# Patient Record
Sex: Male | Born: 1990 | Race: White | Hispanic: No | Marital: Single | Smoking: Never smoker
Health system: Southern US, Community
[De-identification: ages and names within clinical notes are randomized; demographics above are authoritative.]

## PROBLEM LIST (undated history)

## (undated) DIAGNOSIS — F988 Other specified behavioral and emotional disorders with onset usually occurring in childhood and adolescence: Secondary | ICD-10-CM

## (undated) HISTORY — PX: WISDOM TOOTH EXTRACTION: SHX21

---

## 2014-02-23 ENCOUNTER — Emergency Department (HOSPITAL_COMMUNITY)
Admission: EM | Admit: 2014-02-23 | Discharge: 2014-02-24 | Disposition: A | Discharge status: Home / Self Care | Payer: BC Managed Care – PPO | Attending: Emergency Medicine | Admitting: Emergency Medicine

## 2014-02-23 ENCOUNTER — Encounter (HOSPITAL_COMMUNITY): Payer: Self-pay | Admitting: Emergency Medicine

## 2014-02-23 DIAGNOSIS — N509 Disorder of male genital organs, unspecified: Secondary | ICD-10-CM | POA: Insufficient documentation

## 2014-02-23 DIAGNOSIS — Z8659 Personal history of other mental and behavioral disorders: Secondary | ICD-10-CM | POA: Insufficient documentation

## 2014-02-23 DIAGNOSIS — I861 Scrotal varices: Secondary | ICD-10-CM | POA: Diagnosis not present

## 2014-02-23 DIAGNOSIS — N50812 Left testicular pain: Secondary | ICD-10-CM

## 2014-02-23 HISTORY — DX: Other specified behavioral and emotional disorders with onset usually occurring in childhood and adolescence: F98.8

## 2014-02-23 NOTE — ED Provider Notes (Signed)
CSN: [id number]     Arrival date & time 02/23/14  2307 History   First MD Initiated Contact with Patient 02/23/14 2328     Chief Complaint  Patient presents with  . Testicle Pain  . Urinary Retention     (Consider location/radiation/quality/duration/timing/severity/associated sxs/prior Treatment) HPI  Sean Taylor is a 23 y.o. male who is here for intermittent left scrotal pain which started after sexual intercourse 2.5 hours ago. The pain comes in flashes of sharpness, 2- 20 times per minute, sporadically. Currently, pain is gone. He also feels that he cannot void. No specific trauma. No prior similar problem. No other known modifying factors.  Past Medical History  Diagnosis Date  . ADD (attention deficit disorder)    Past Surgical History  Procedure Laterality Date  . Wisdom tooth extraction     History reviewed. No pertinent family history. History  Substance Use Topics  . Smoking status: Never Smoker   . Smokeless tobacco: Not on file  . Alcohol Use: Yes    Review of Systems  All other systems reviewed and are negative.     Allergies  Review of patient's allergies indicates no known allergies.  Home Medications   Prior to Admission medications   Medication Sig Start Date End Date Taking? Authorizing Provider  traMADol (ULTRAM) 50 MG tablet Take 1 tablet (50 mg total) by mouth every 6 (six) hours as needed for moderate pain or severe pain. 02/24/14   Olivia Mackie, MD   BP 111/67  Pulse 61  Temp(Src) 99.3 F (37.4 C) (Oral)  Resp 18  Ht  (1.854 m)  Wt 195 lb (88.451 kg)  BMI 25.73 kg/m2  SpO2 97% Physical Exam  Nursing note and vitals reviewed. Constitutional: He is oriented to person, place, and time. He appears well-developed and well-nourished.  HENT:  Head: Normocephalic and atraumatic.  Right Ear: External ear normal.  Left Ear: External ear normal.  Eyes: Conjunctivae and EOM are normal. Pupils are equal, round, and reactive to light.  Neck:  Normal range of motion and phonation normal. Neck supple.  Cardiovascular: Normal rate, regular rhythm and normal heart sounds.   Pulmonary/Chest: Effort normal and breath sounds normal. He exhibits no bony tenderness.  Abdominal: Soft. There is no tenderness.  Genitourinary:  Circumsized, no urethral discharge. Testicles with normal lie. Scrotum with normal appearance. Testicles are not tender. Mildly tender left, epididymitis area. No groin swelling, mass/nodes, or tenderness.  Musculoskeletal: Normal range of motion.  Neurological: He is alert and oriented to person, place, and time. No cranial nerve deficit or sensory deficit. He exhibits normal muscle tone. Coordination normal.  Skin: Skin is warm, dry and intact.  Psychiatric: He has a normal mood and affect. His behavior is normal. Judgment and thought content normal.    ED Course  Procedures (including critical care time)  2338- [place] ordered to evaluate scrotal contents. Pt declined analgesia.  Medications - No data to display  No data found.     Labs Review Labs Reviewed  URINALYSIS, ROUTINE W REFLEX MICROSCOPIC    Imaging Review No results found.   EKG Interpretation None      MDM   Final diagnoses:  Testicular pain, left  Varicocele    Nursing Notes Reviewed/ Care Coordinated Applicable Imaging Reviewed Interpretation of Laboratory Data incorporated into ED treatment  Disposition per Dr. Norlene Campbell after Testicular U/S    Flint Melter, MD 02/26/14 [phone]

## 2014-02-23 NOTE — ED Notes (Signed)
Pt presents with Left testicular pain and urinary retention starting "a few hours ago." Pt reports increase pain when he goes from a lying to a standing position. Pt denies penile discharge or bleeding

## 2014-02-24 ENCOUNTER — Emergency Department (HOSPITAL_COMMUNITY): Payer: BC Managed Care – PPO

## 2014-02-24 LAB — URINALYSIS, ROUTINE W REFLEX MICROSCOPIC
Bilirubin Urine: NEGATIVE
Glucose, UA: NEGATIVE mg/dL
Hgb urine dipstick: NEGATIVE
Ketones, ur: NEGATIVE mg/dL
Leukocytes, UA: NEGATIVE
NITRITE: NEGATIVE
PROTEIN: NEGATIVE mg/dL
SPECIFIC GRAVITY, URINE: 1.02 (ref 1.005–1.030)
Urobilinogen, UA: 0.2 mg/dL (ref 0.0–1.0)
pH: 7.5 (ref 5.0–8.0)

## 2014-02-24 MED ORDER — TRAMADOL HCL 50 MG PO TABS: 50.0000 mg | ORAL_TABLET | Freq: Four times a day (QID) | ORAL | Status: AC | PRN

## 2014-02-24 NOTE — Discharge Instructions (Signed)
Varicocele A varicocele is a swelling of veins in the scrotum (the bag of skin that contains the testicles). It is most common in young men. It occurs most often on the left side. Small or painless varicoceles do not need treatment. Most often, this is not a serious problem, but further tests may be needed to confirm the diagnosis. Surgery may be needed if complications of varicoceles arise. Rarely, varicoceles can reoccur after surgery. CAUSES  The swelling is due to blood backing up in the vein that leads from the testicle back to the body. Blood backs up because the valves inside the vein are not working properly. Veins normally return blood to the heart. Valves in veins are supposed to be one-way valves. They should not allow blood to flow backwards. If the valves do not work well, blood can pool in a vein and make it swell. The same thing happens with varicose veins in the leg. SYMPTOMS  A varicocele most often causes no symptoms. When they occur, symptoms include:   Swelling on one side of the scrotum.  Swelling that is more obvious when standing up.  A lumpy feeling in the scrotum.  Heaviness on one side of the scrotum.  Dull ache in the scrotum, especially after exercise or prolonged standing or sitting.  Slower growth or reduced size of the testicle on the side of the varicocele (in young males).  Problems with fertility can arise if the testicle does not grow normally. DIAGNOSIS  Varicocele is usually diagnosed by a physical exam. Sometimes ultrasonography is done. TREATMENT  Usually, varicoceles need no treatment. They are often routinely monitored on exam by your caregiver to ensure they do not slow the growth of the testicle on that side. Treatment may be needed if:  The varicocele is large.  There is a lot of pain.  The varicocele causes a decrease in the size of the testicle in a growing adolescent.  The other testicle is absent or not normal.  Varicoceles are found on  both sides of the scrotum.  There is pain when exercising.  There are fertility problems. There are two types of treatment:  Surgery. The surgeon ties off the swollen veins. Surgery may be done with an incision in the skin or through a laparoscope. The surgery is usually done in an outpatient setting. Outpatient means there is no overnight stay in a hospital.  Embolization. A small tube is placed in a vein and guided into the swollen veins. X-rays are used to guide the small tube. Tiny metal coils or other blocking items are put through the tube. This blocks swollen veins and the flow of blood. This is usually done in an outpatient setting without the use of general anesthesia. HOME CARE INSTRUCTIONS  To decrease discomfort:  Wear supportive underwear.  Use an athletic supporter for sports.  Only take over-the-counter or prescription medicines for pain or discomfort as directed by your caregiver. SEEK MEDICAL CARE IF:   Pain is increasing.  Swelling does not decrease when lying down.  Testicle is smaller.  The testicle becomes enlarged, swollen, red, or painful. Document Released: 08/22/2000 Document Revised: 08/08/2011 Document Reviewed: 08/26/2009 Exit[employer] Patient Information 2015 [employer]. This information is not intended to replace advice given to you by your health care provider. Make sure you discuss any questions you have with your health care provider.  

## 2014-02-24 NOTE — ED Notes (Signed)
Patient transported to Ultrasound 

## 2014-02-24 NOTE — ED Provider Notes (Signed)
Care assumed from Dr Effie Shy awaiting ua and ultrasound due to left testicular pain.  Clinical exam not c/w torsion.   Results for orders placed during the hospital encounter of 02/23/14  URINALYSIS, ROUTINE W REFLEX MICROSCOPIC      Result Value Ref Range   Color, Urine YELLOW  YELLOW   APPearance CLEAR  CLEAR   Specific Gravity, Urine 1.020  1.005 - 1.030   pH 7.5  5.0 - 8.0   Glucose, UA NEGATIVE  NEGATIVE mg/dL   Hgb urine dipstick NEGATIVE  NEGATIVE   Bilirubin Urine NEGATIVE  NEGATIVE   Ketones, ur NEGATIVE  NEGATIVE mg/dL   Protein, ur NEGATIVE  NEGATIVE mg/dL   Urobilinogen, UA 0.2  0.0 - 1.0 mg/dL   Nitrite NEGATIVE  NEGATIVE   Leukocytes, UA NEGATIVE  NEGATIVE   US Scrotum  02/24/2014   CLINICAL DATA:  Left scrotal pain  EXAM: SCROTAL ULTRASOUND  DOPPLER ULTRASOUND OF THE TESTICLES  TECHNIQUE: Complete ultrasound examination of the testicles, epididymis, and other scrotal structures was performed. Color and spectral Doppler ultrasound were also utilized to evaluate blood flow to the testicles.  COMPARISON:  None.  FINDINGS: Right testicle  Measurements: 5.3 x 2.6 x 3.3 cm. No mass or microlithiasis visualized.  Left testicle  Measurements: 4.8 x 2.7 x 3.5 cm. No mass or microlithiasis visualized.  Right epididymis:  Normal in size and appearance.  Left epididymis:  Normal in size and appearance.  Hydrocele:  Physiologic fluid bilaterally, left greater than right.  Varicocele:  Small left varicocele.  Pulsed Doppler interrogation of both testes demonstrates low resistance arterial and venous waveforms bilaterally.  IMPRESSION: Small left varicocele.  Otherwise negative scrotal ultrasound.  No evidence of testicular torsion.   Electronically Signed   By: Charline Bills M.D.   On: 02/24/2014 01:40   [place] Art/ven Flow Abd Pelv Doppler  02/24/2014   CLINICAL DATA:  Left scrotal pain  EXAM: SCROTAL ULTRASOUND  DOPPLER ULTRASOUND OF THE TESTICLES  TECHNIQUE: Complete ultrasound examination  of the testicles, epididymis, and other scrotal structures was performed. Color and spectral Doppler ultrasound were also utilized to evaluate blood flow to the testicles.  COMPARISON:  None.  FINDINGS: Right testicle  Measurements: 5.3 x 2.6 x 3.3 cm. No mass or microlithiasis visualized.  Left testicle  Measurements: 4.8 x 2.7 x 3.5 cm. No mass or microlithiasis visualized.  Right epididymis:  Normal in size and appearance.  Left epididymis:  Normal in size and appearance.  Hydrocele:  Physiologic fluid bilaterally, left greater than right.  Varicocele:  Small left varicocele.  Pulsed Doppler interrogation of both testes demonstrates low resistance arterial and venous waveforms bilaterally.  IMPRESSION: Small left varicocele.  Otherwise negative scrotal ultrasound.  No evidence of testicular torsion.   Electronically Signed   By: Charline Bills M.D.   On: 02/24/2014 01:40    Will d/c home to f/u with urology, pain medication.  Olivia Mackie, MD 02/24/14 [phone]

## 2015-03-04 IMAGING — US US ART/VEN ABD/PELV/SCROTUM DOPPLER LTD
1 series · 14 of 25 positions shown · non-contrast
Comparison: None.

CLINICAL DATA: Left scrotal pain

EXAM:
SCROTAL ULTRASOUND
DOPPLER ULTRASOUND OF THE TESTICLES
TECHNIQUE: Complete ultrasound examination of the testicles, epididymis, and
other scrotal structures was performed. Color and spectral Doppler
ultrasound were also utilized to evaluate blood flow to the
testicles.

[Series 1: us art/ven abd/pelv/scrotum doppler ltd · 0.07mm/px · 14 of 49 slices shown]
[im 1/49]
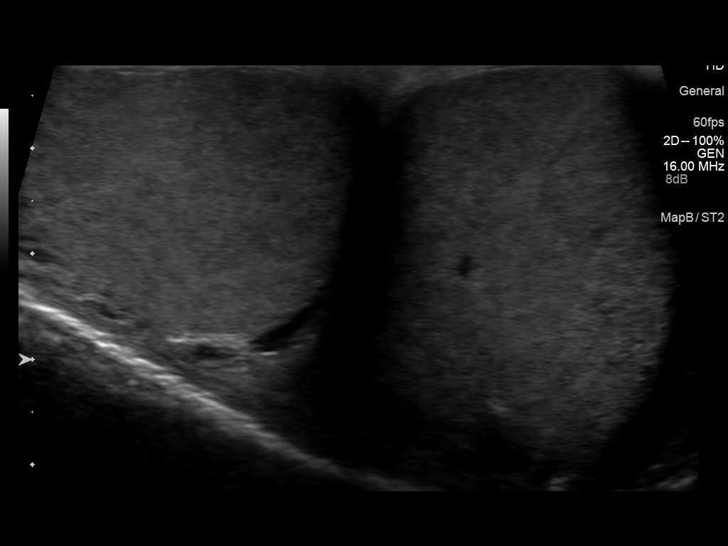
[im 5/49]
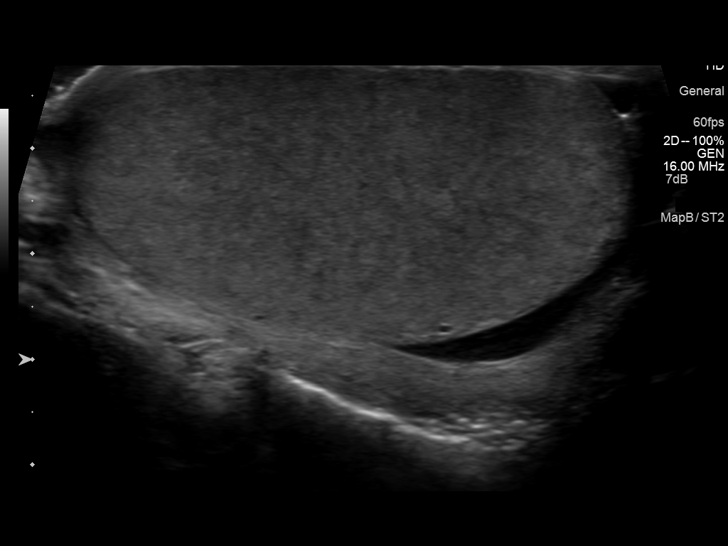
[im 9/49]
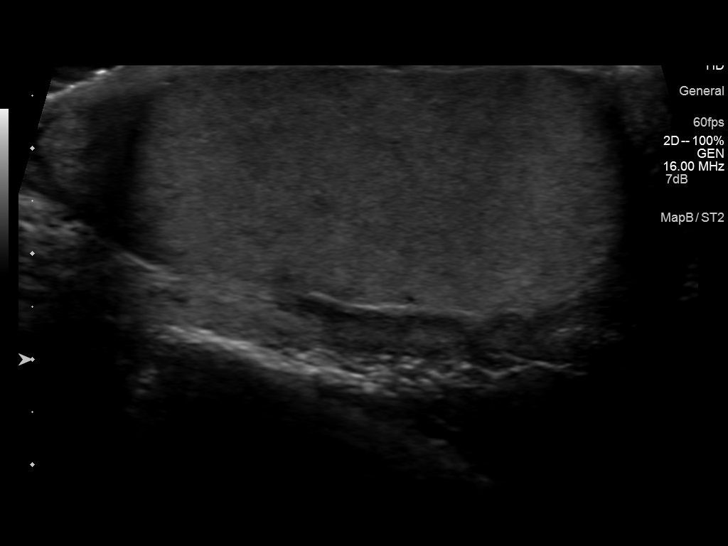
[im 13/49]
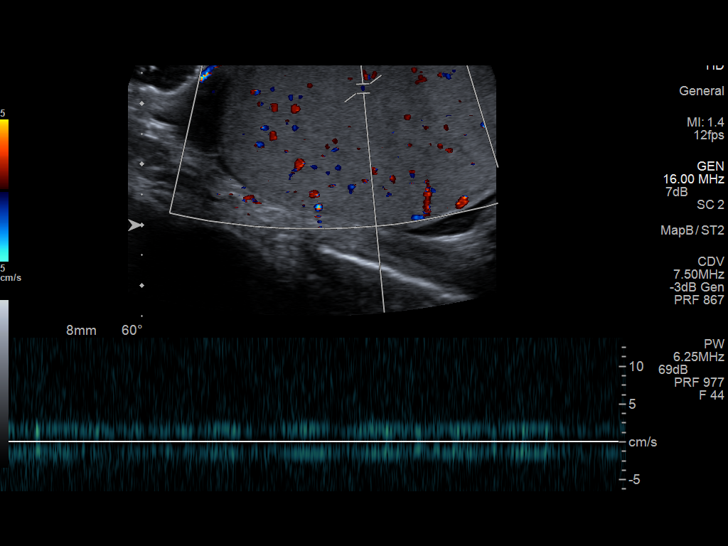
[im 17/49]
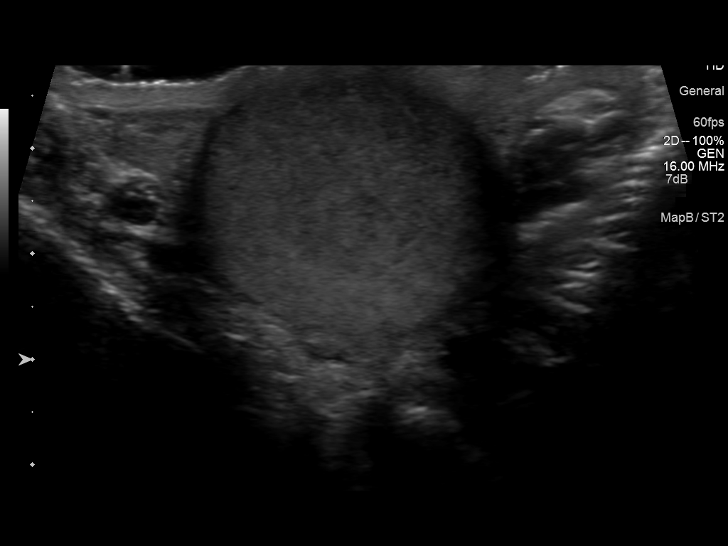
[im 19/49]
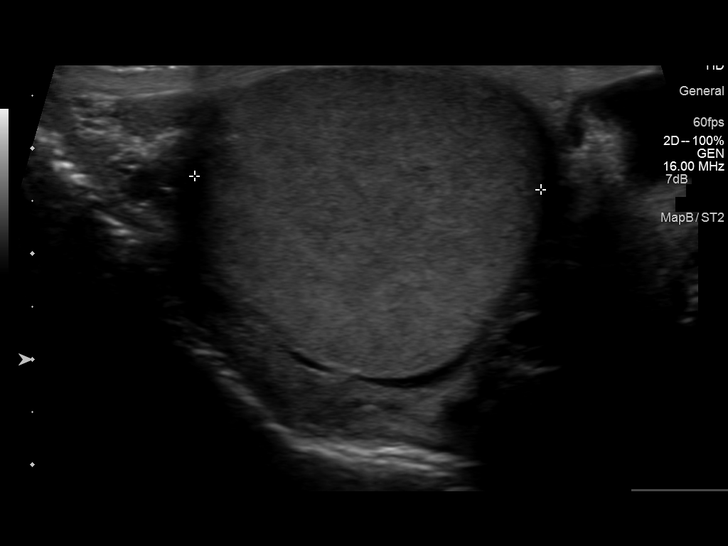
[im 23/49]
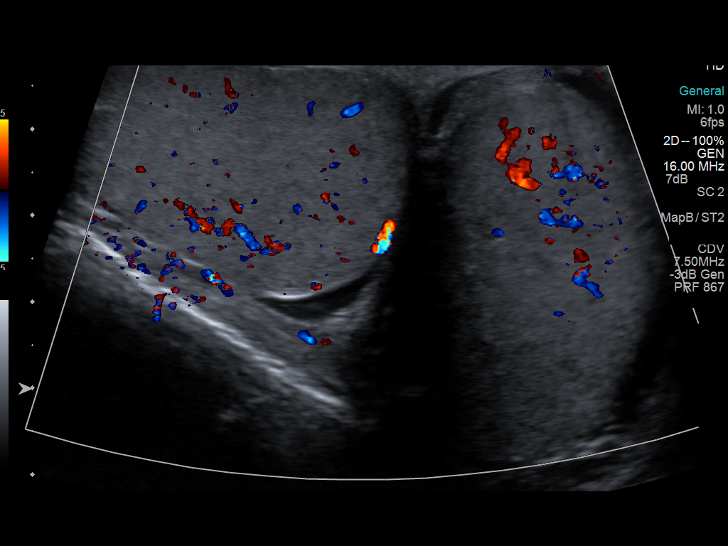
[im 27/49]
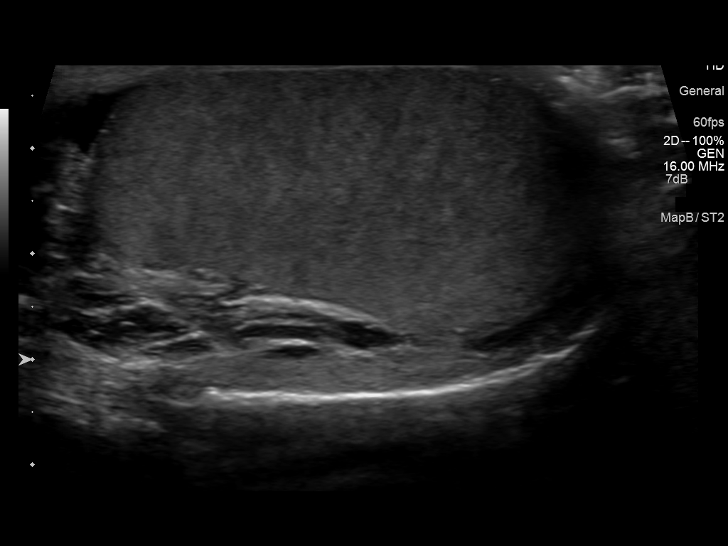
[im 31/49]
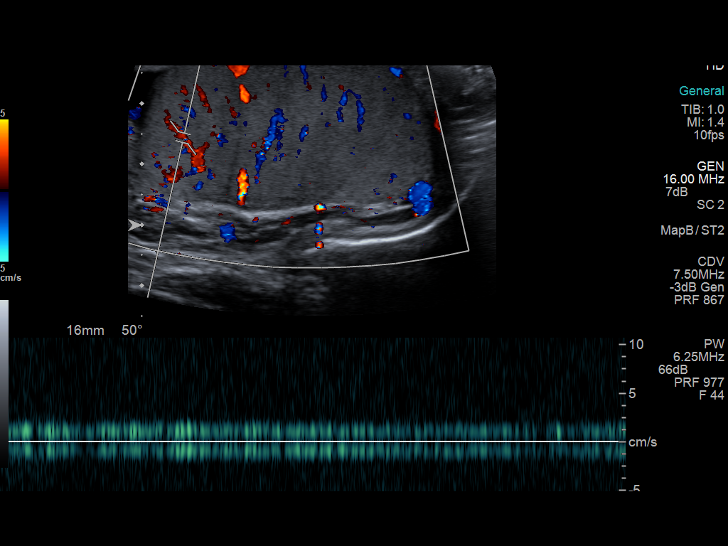
[im 33/49]
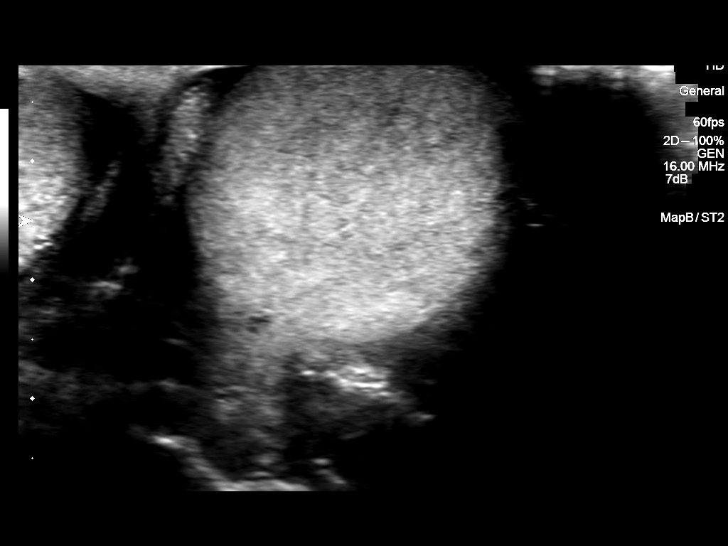
[im 37/49]
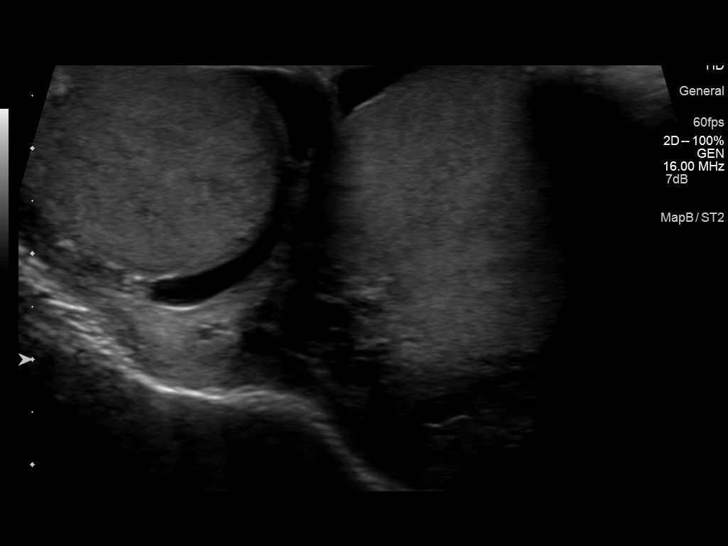
[im 41/49]
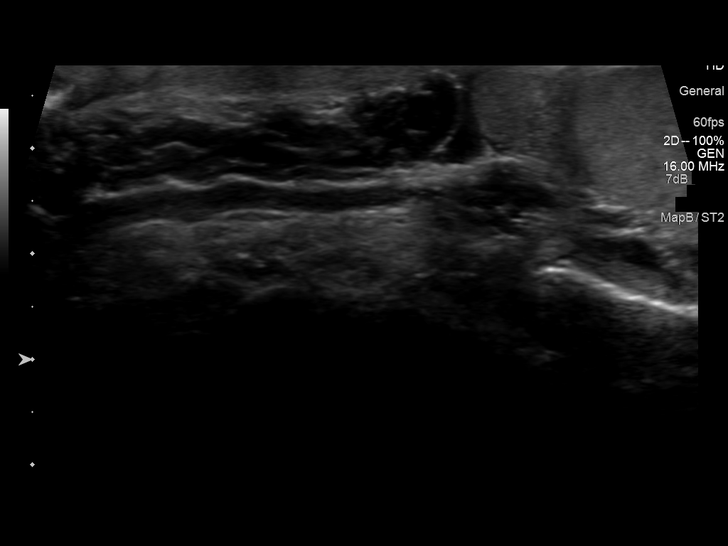
[im 45/49]
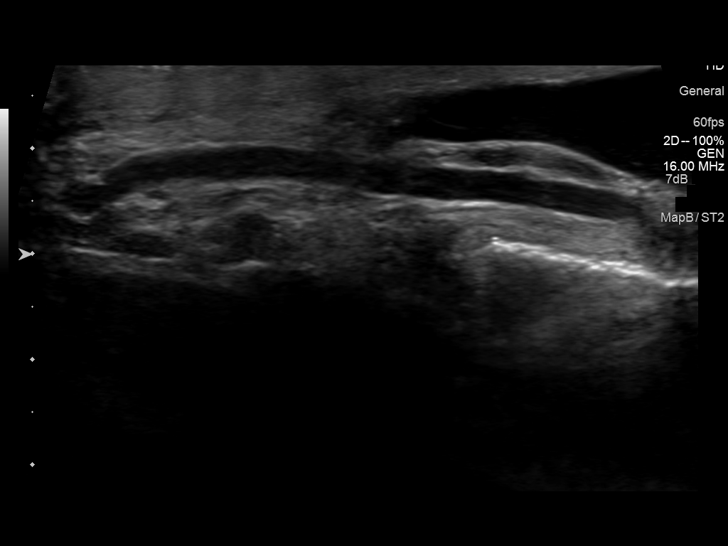
[im 49/49]
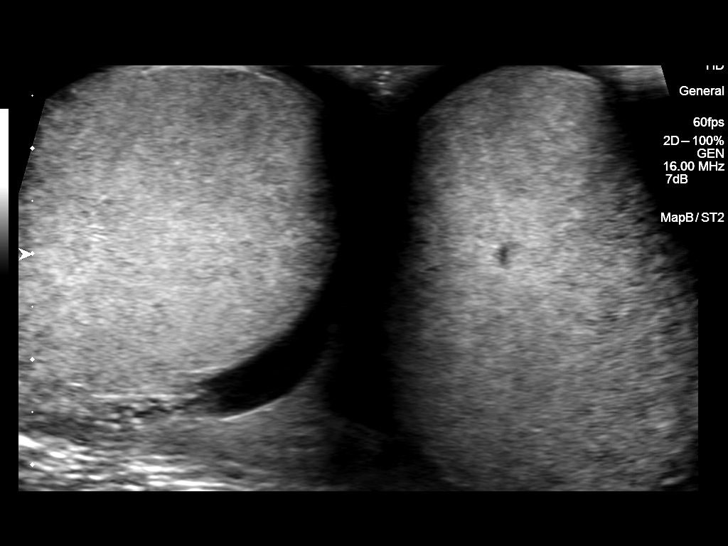

[14 of 25 positions shown; findings below may reference images not displayed]

FINDINGS: Right testicle

Measurements: 5.3 x 2.6 x 3.3 cm. No mass or microlithiasis
visualized.

Left testicle

Measurements: 4.8 x 2.7 x 3.5 cm. No mass or microlithiasis
visualized.

Right epididymis:  Normal in size and appearance.

Left epididymis:  Normal in size and appearance.

Hydrocele:  Physiologic fluid bilaterally, left greater than right.

Varicocele:  Small left varicocele.

Pulsed Doppler interrogation of both testes demonstrates low
resistance arterial and venous waveforms bilaterally.
IMPRESSION: Small left varicocele.

Otherwise negative scrotal ultrasound.

No evidence of testicular torsion.
